# Patient Record
Sex: Female | Born: 1970 | Hispanic: Yes | Marital: Single | Smoking: Never smoker
Health system: Southern US, Community
[De-identification: ages and names within clinical notes are randomized; demographics above are authoritative.]

## PROBLEM LIST (undated history)

## (undated) DIAGNOSIS — M778 Other enthesopathies, not elsewhere classified: Secondary | ICD-10-CM

## (undated) DIAGNOSIS — M62838 Other muscle spasm: Secondary | ICD-10-CM

## (undated) HISTORY — DX: Other enthesopathies, not elsewhere classified: M77.8

## (undated) HISTORY — PX: CHOLECYSTECTOMY, LAPAROSCOPIC: SHX56

## (undated) HISTORY — DX: Other muscle spasm: M62.838

---

## 2005-10-08 ENCOUNTER — Emergency Department: Payer: Self-pay | Admitting: Emergency Medicine

## 2005-10-09 ENCOUNTER — Ambulatory Visit: Payer: Self-pay | Admitting: Emergency Medicine

## 2005-10-12 ENCOUNTER — Ambulatory Visit: Payer: Self-pay | Admitting: General Surgery

## 2005-10-15 ENCOUNTER — Inpatient Hospital Stay: Payer: Self-pay | Admitting: General Surgery

## 2006-04-17 ENCOUNTER — Emergency Department: Payer: Self-pay | Admitting: Emergency Medicine

## 2007-01-01 IMAGING — CR DG CHOLANGIOGRAM OPERATIVE
1 series · 2 of 2 positions shown · non-contrast
Comparison: none

REASON FOR EXAM: Op cholangiogram
COMMENTS:

[Series 1: view not recorded · 0.17mm/px · 2 of 2 slices shown]
[im 1/2]
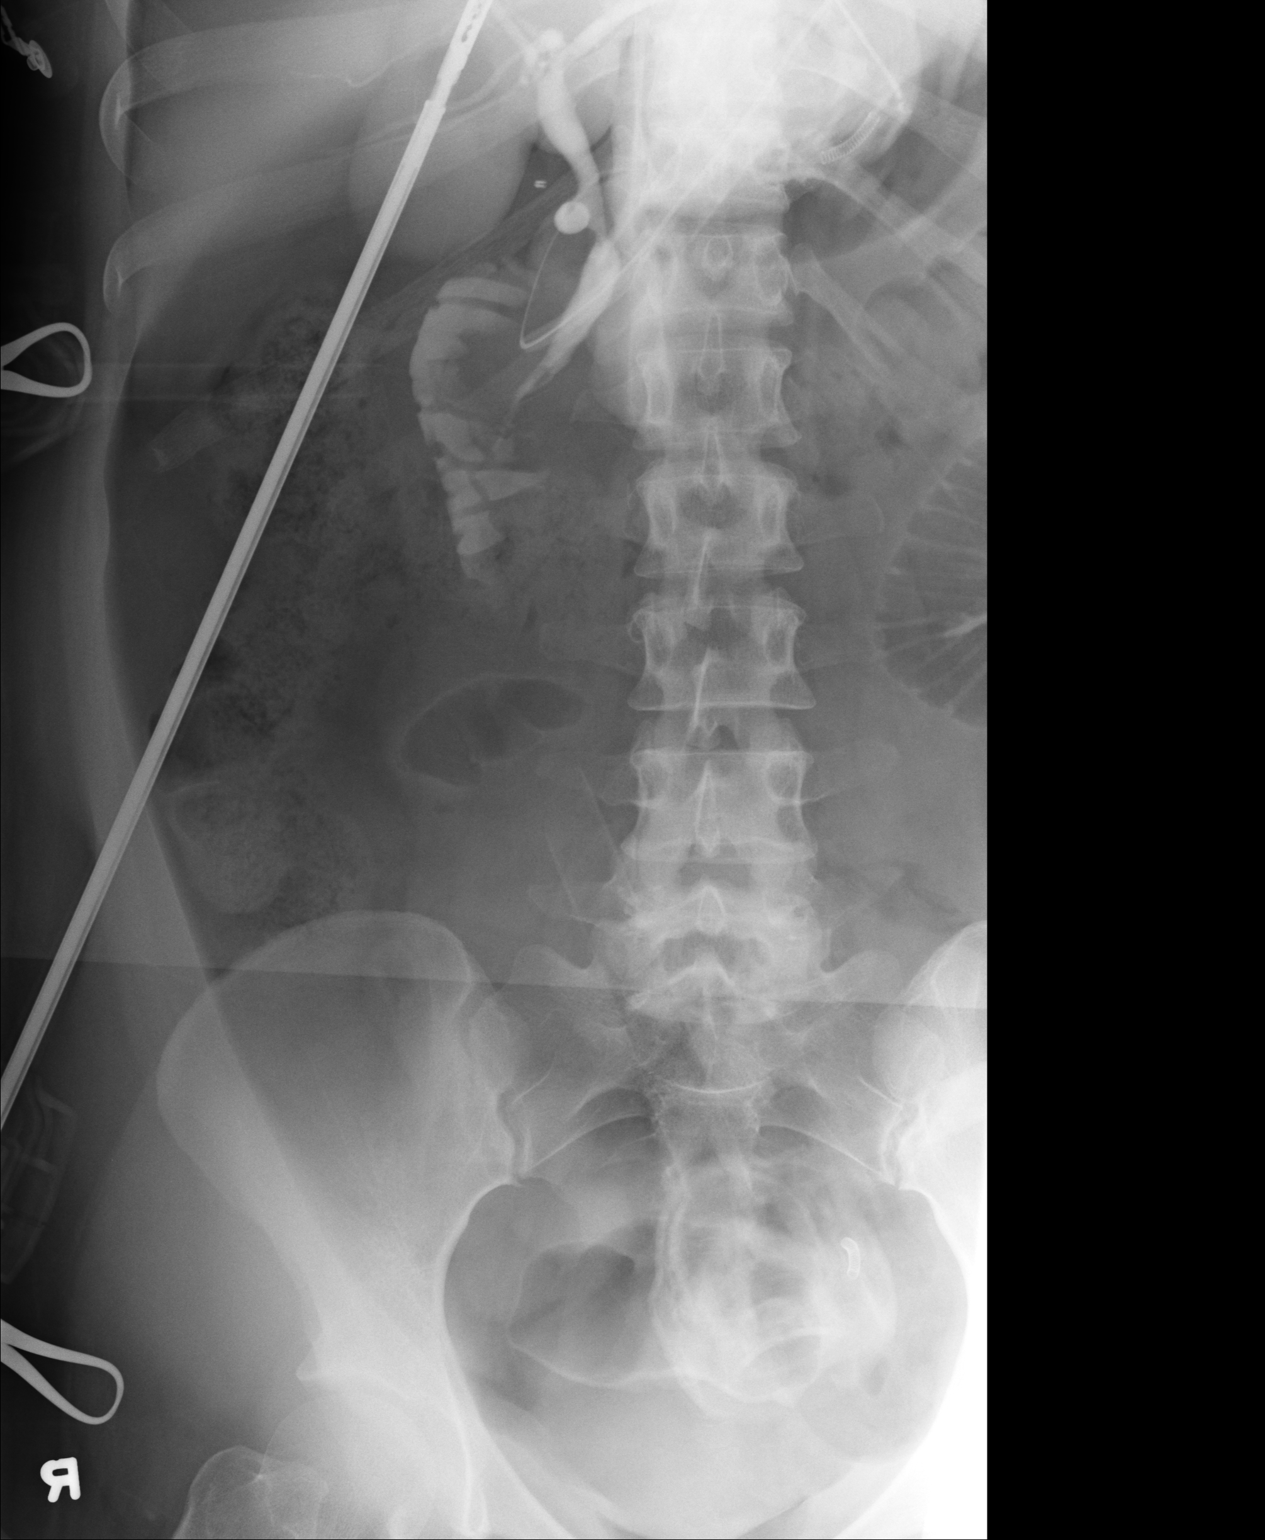
[im 2/2]
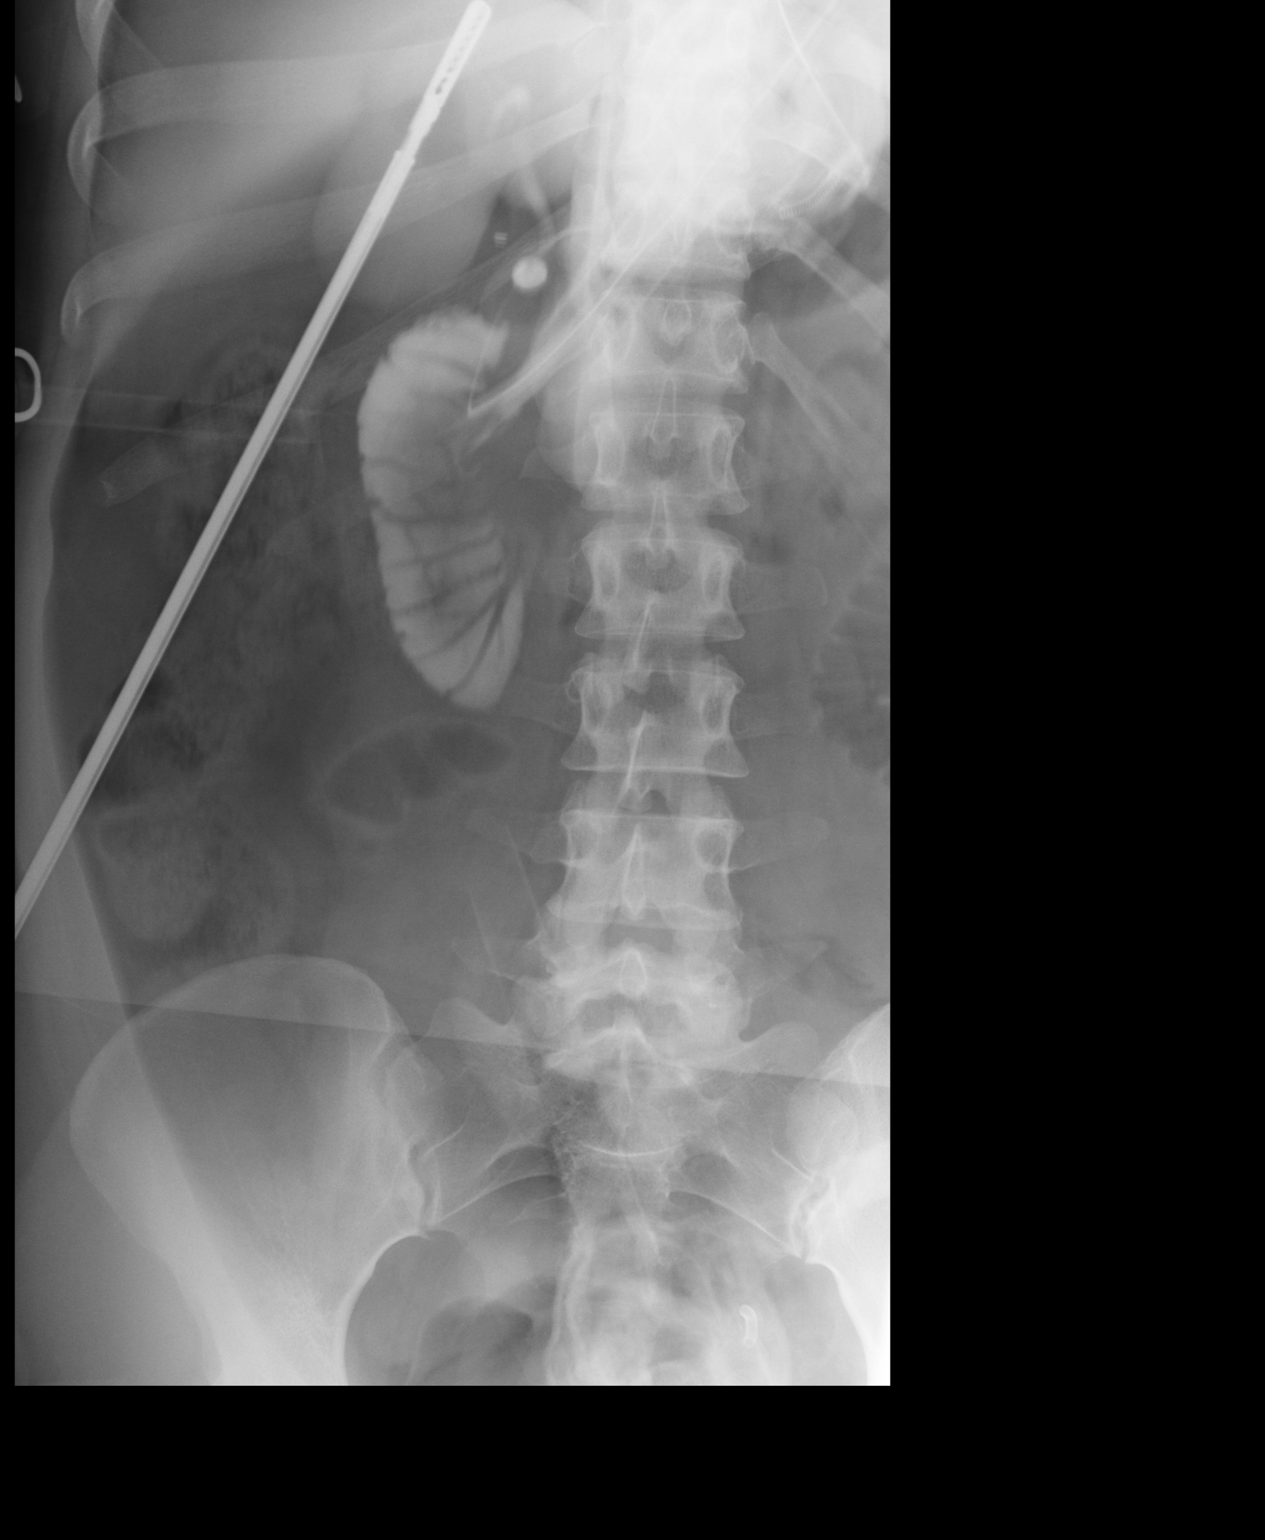

[2 of 2 positions shown; findings below may reference images not displayed]

PROCEDURE:     DXR - DXR CHOLANGIOGRAM OP (INITIAL)  - October 12, 2005 [DATE]

RESULT:       Contrast material is visualized in the common duct and hepatic
ducts.  There is a radiolucent filling defect in the distal common duct
which could represent stone, air bubble or clot. Contrast is noted to flow
into the duodenum without evidence of obstruction.  No dilatation of the
common duct is observed.
IMPRESSION: 1.     There is a nonspecific filling defect in the distal common duct as
noted above.
2.     No common duct obstruction is seen.

## 2007-01-03 IMAGING — CR DG ABDOMEN 3V
1 series · 4 of 4 positions shown · non-contrast
Comparison: none

REASON FOR EXAM: abdominal pain
COMMENTS:

[Series 1: view not recorded · 0.17mm/px · 4 of 4 slices shown]
[im 1/4]
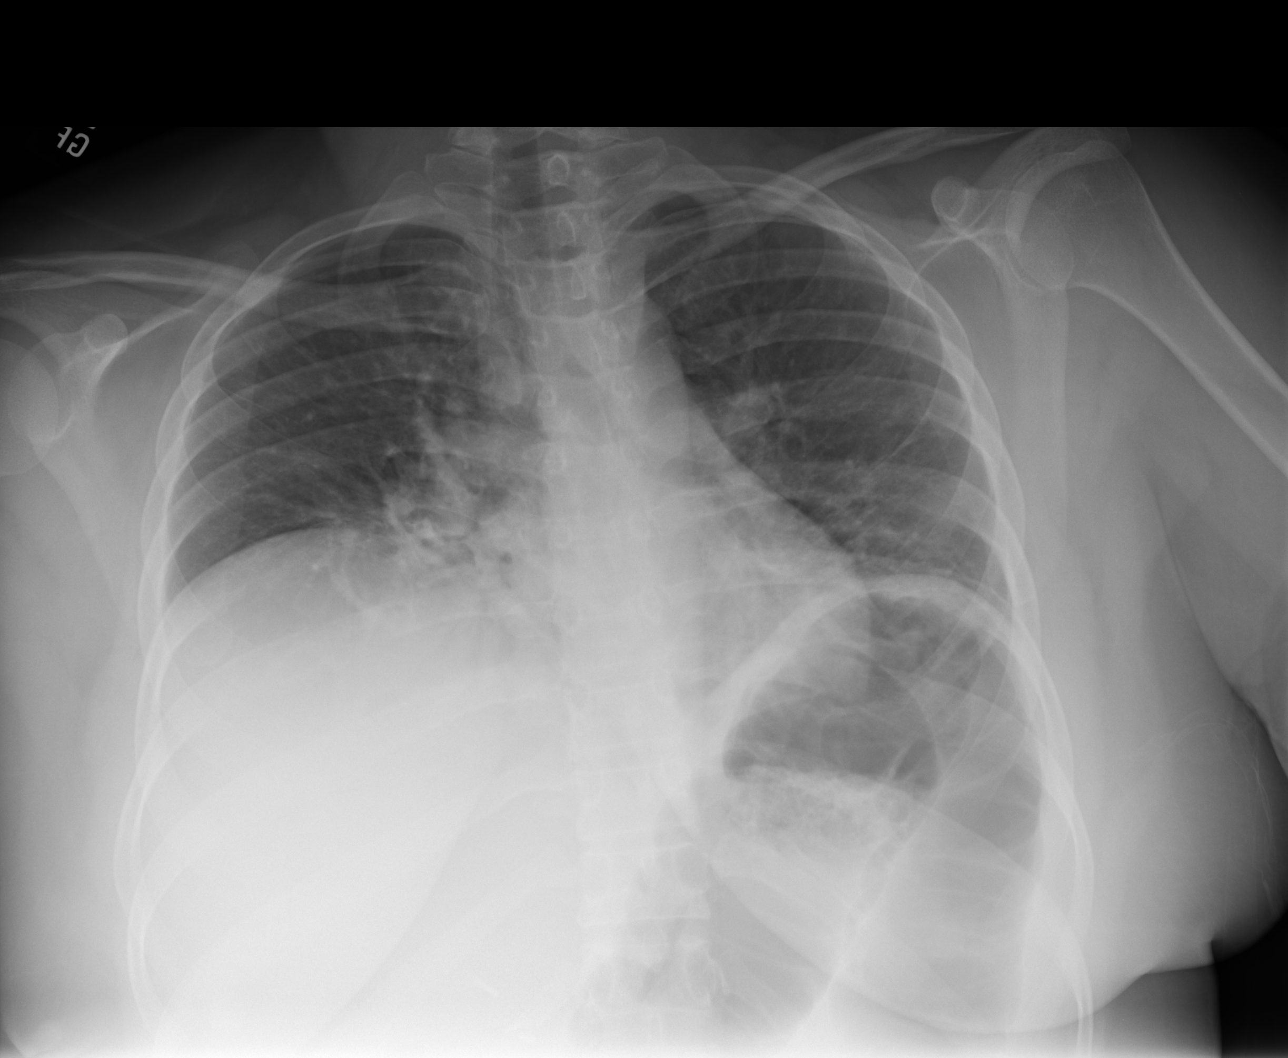
[im 2/4]
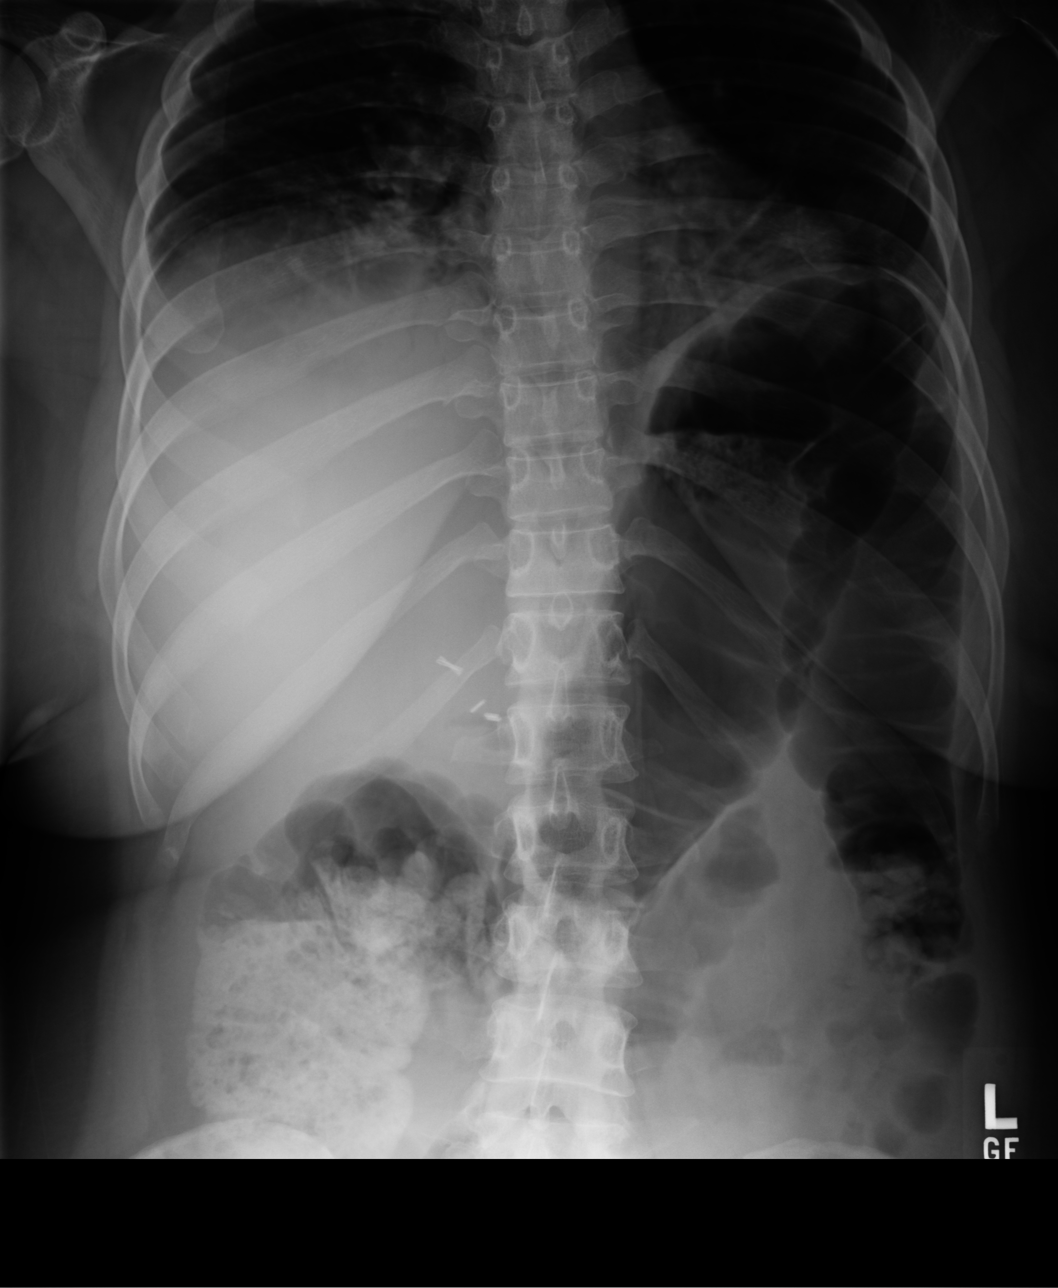
[im 3/4]
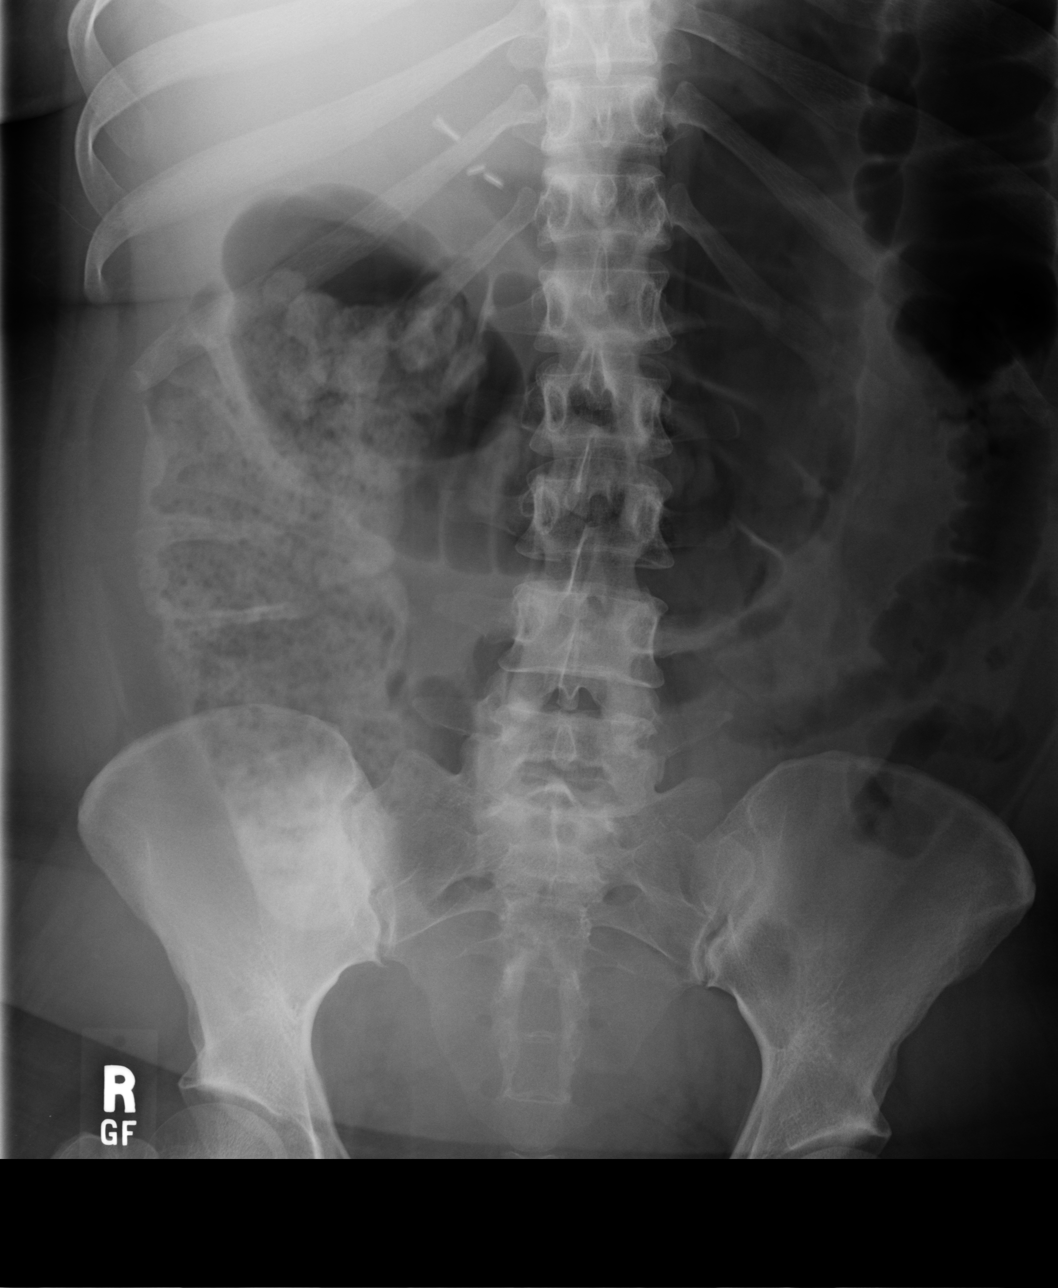
[im 4/4]
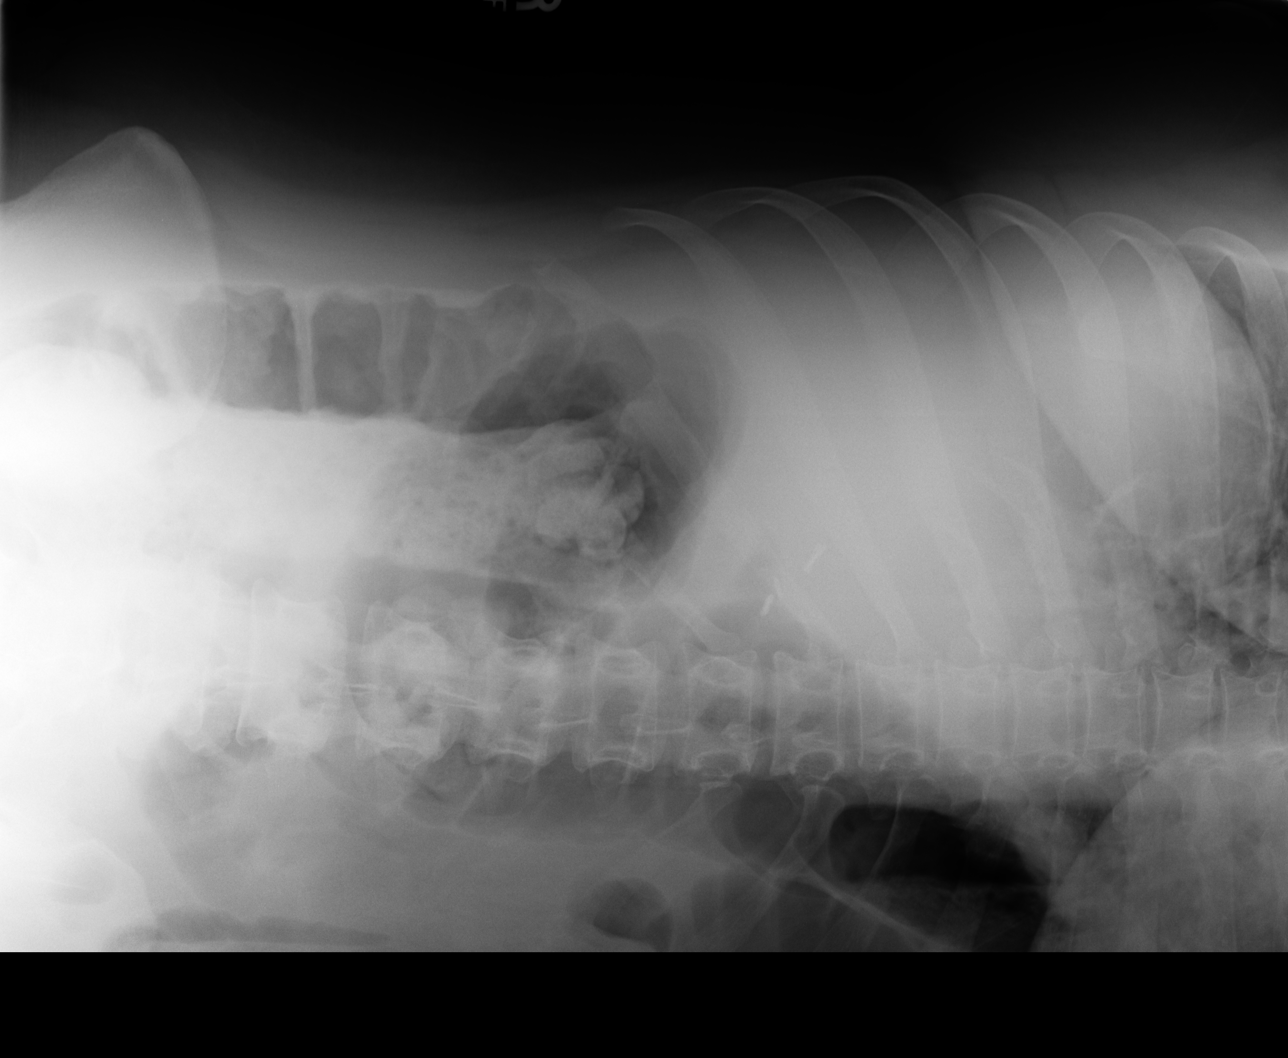

[4 of 4 positions shown; findings below may reference images not displayed]

PROCEDURE:     DXR - DXR ABDOMEN 3-WAY (INCL PA CXR)  - October 14, 2005  [DATE]

RESULT:          The soft tissue structures are unremarkable.  Surgical
clips are noted in RIGHT upper quadrant.  The gas pattern is nonspecific.
Air-filled loops of small bowel are noted.  There is no free air.  Bibasilar
atelectasis and/or pneumonia is present.
IMPRESSION: 1.     Bibasilar atelectasis and/or pneumonia.
2.     The gas pattern is nonspecific.  There are nondilated air-filled
loops of small bowel.  The colonic gas pattern is normal.  A very minimal
adynamic ileus cannot be entirely excluded.
3.     Surgical clips are noted in the RIGHT upper quadrant.  If the
abdominal symptoms persist, a followup abdominal series may prove useful.

## 2015-04-23 ENCOUNTER — Emergency Department: Payer: No Typology Code available for payment source

## 2015-04-23 ENCOUNTER — Emergency Department
Admission: EM | Admit: 2015-04-23 | Discharge: 2015-04-23 | Disposition: A | Discharge status: Home / Self Care | Payer: No Typology Code available for payment source | Attending: Emergency Medicine | Admitting: Emergency Medicine

## 2015-04-23 ENCOUNTER — Encounter: Payer: Self-pay | Admitting: Emergency Medicine

## 2015-04-23 DIAGNOSIS — S3991XA Unspecified injury of abdomen, initial encounter: Secondary | ICD-10-CM | POA: Insufficient documentation

## 2015-04-23 DIAGNOSIS — Y998 Other external cause status: Secondary | ICD-10-CM | POA: Insufficient documentation

## 2015-04-23 DIAGNOSIS — Y9389 Activity, other specified: Secondary | ICD-10-CM | POA: Insufficient documentation

## 2015-04-23 DIAGNOSIS — S59901A Unspecified injury of right elbow, initial encounter: Secondary | ICD-10-CM | POA: Diagnosis present

## 2015-04-23 DIAGNOSIS — S5001XA Contusion of right elbow, initial encounter: Secondary | ICD-10-CM | POA: Insufficient documentation

## 2015-04-23 DIAGNOSIS — F10120 Alcohol abuse with intoxication, uncomplicated: Secondary | ICD-10-CM | POA: Diagnosis not present

## 2015-04-23 DIAGNOSIS — S20211A Contusion of right front wall of thorax, initial encounter: Secondary | ICD-10-CM | POA: Insufficient documentation

## 2015-04-23 DIAGNOSIS — G44319 Acute post-traumatic headache, not intractable: Secondary | ICD-10-CM | POA: Insufficient documentation

## 2015-04-23 DIAGNOSIS — F10920 Alcohol use, unspecified with intoxication, uncomplicated: Secondary | ICD-10-CM

## 2015-04-23 DIAGNOSIS — S060X9A Concussion with loss of consciousness of unspecified duration, initial encounter: Secondary | ICD-10-CM | POA: Insufficient documentation

## 2015-04-23 DIAGNOSIS — Y92410 Unspecified street and highway as the place of occurrence of the external cause: Secondary | ICD-10-CM | POA: Diagnosis not present

## 2015-04-23 LAB — COMPREHENSIVE METABOLIC PANEL
ALK PHOS: 57 U/L (ref 38–126)
ALT: 14 U/L (ref 14–54)
ANION GAP: 7 (ref 5–15)
AST: 32 U/L (ref 15–41)
Albumin: 3.9 g/dL (ref 3.5–5.0)
BILIRUBIN TOTAL: 0.7 mg/dL (ref 0.3–1.2)
BUN: 13 mg/dL (ref 6–20)
CALCIUM: 8.8 mg/dL — AB (ref 8.9–10.3)
CO2: 22 mmol/L (ref 22–32)
Chloride: 109 mmol/L (ref 101–111)
Creatinine, Ser: 0.66 mg/dL (ref 0.44–1.00)
Glucose, Bld: 97 mg/dL (ref 65–99)
POTASSIUM: 4.3 mmol/L (ref 3.5–5.1)
Sodium: 138 mmol/L (ref 135–145)
TOTAL PROTEIN: 7.6 g/dL (ref 6.5–8.1)

## 2015-04-23 LAB — CBC
HEMATOCRIT: 33.7 % — AB (ref 35.0–47.0)
HEMOGLOBIN: 10.3 g/dL — AB (ref 12.0–16.0)
MCH: 18.9 pg — AB (ref 26.0–34.0)
MCHC: 30.6 g/dL — AB (ref 32.0–36.0)
MCV: 61.8 fL — ABNORMAL LOW (ref 80.0–100.0)
Platelets: 367 10*3/uL (ref 150–440)
RBC: 5.45 MIL/uL — ABNORMAL HIGH (ref 3.80–5.20)
RDW: 18.3 % — AB (ref 11.5–14.5)
WBC: 9.8 10*3/uL (ref 3.6–11.0)

## 2015-04-23 LAB — ETHANOL: ALCOHOL ETHYL (B): 13 mg/dL — AB (ref <5)

## 2015-04-23 MED ORDER — NAPROXEN 500 MG PO TABS: 500.0000 mg | ORAL_TABLET | Freq: Two times a day (BID) | ORAL | Status: AC

## 2015-04-23 MED ORDER — IOHEXOL 350 MG/ML SOLN
100.0000 mL | Freq: Once | INTRAVENOUS | Status: AC | PRN
  Administered 2015-04-23: 100 mL via INTRAVENOUS
  Filled 2015-04-23: qty 100
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
## 2015-04-23 NOTE — ED Notes (Signed)
PA at bedside.

## 2015-04-23 NOTE — Discharge Instructions (Signed)
Traumatismo Torcico Contuso (Blunt Chest Trauma)  El traumatismo torcico contuso es una lesin causada por un golpe en el pecho. Estas lesiones suelen ser muy dolorosas. Generalmente resulta en un hematoma o en costillas rotas (fracturadas). La mayor parte de los hematomas y las fracturas de costillas por traumatismos torcicos contusos mejoran despus de 1 a 3 semanas de reposo y uso de medicamentos para Chief Technology Officerel dolor. Generalmente, los tejidos blandos de la pared torcica tambin se lesionan, lo que produce dolor y hematomas. Los rganos internos, como el corazn y los pulmones, tambin pueden sufrir lesiones. El traumatismo torcico contuso puede producir problemas mdicos graves. Este tipo de lesin requiere atencin mdica inmediata.  CAUSAS   Colisiones en vehculos de motor.  Cadas.  Violencia fsica.  Lesiones deportivas. SNTOMAS   Dolor en el pecho. El dolor puede empeorar al moverse o respirar profundamente.  Falta de aire.  Aturdimiento.  Hematomas.  Sensibilidad.  Hinchazn. DIAGNSTICO  El mdico le har un examen fsico. Podrn tomarle radiografas para comprobar si hay fracturas. Sin embargo, las fracturas pequeas en las costillas pueden no aparecer en las radiografas hasta unos das despus de la lesin. Si se sospecha una lesin ms grave, podrn indicarle otras pruebas de diagnstico por imgenes. Puede incluir ecografas, tomografa computada (TC) o resonancia magntica (IMR).  TRATAMIENTO  El tratamiento depende de la gravedad de la lesin. El mdico podr recetarle medicamentos para Primary school teachercalmar el dolor e indicarle ejercicios de respiracin profunda.  INSTRUCCIONES PARA EL CUIDADO EN EL HOGAR   Limite sus actividades hasta que pueda moverse sin sentir Scientist, forensicdemasiado dolor.  No realice trabajos extenuantes hasta que la lesin se haya curado.  Aplique hielo sobre la zona lesionada.  Ponga el hielo en una bolsa plstica.  Colquese una toalla entre la piel y la bolsa  de hielo.  Deje el hielo durante 15 a 20 minutos, 3 a 4 veces por da.  Podr utilizar una faja para las costillas para reducir Chief Technology Officerel dolor segn le hayan indicado.  Realice inspiraciones, profundas segn las indicaciones del mdico, para mantener los pulmones limpios.  Slo tome medicamentos de venta libre o recetados para Primary school teachercalmar el dolor, la fiebre, o el Lafayettemalestar, segn las indicaciones de su mdico. SOLICITE ATENCIN MDICA DE Engelhard CorporationNMEDIATO SI:  Siente falta de aire o dolor en el pecho cada vez ms intensos.  Tose y escupe sangre.  Tiene nuseas, vmitos o dolor abdominal.  Tiene fiebre.  Se siente mareado, dbil o se desmaya. ASEGRESE DE QUE:   Comprende estas instrucciones.  Controlar su enfermedad.  Solicitar ayuda de inmediato si no mejora o si empeora.   Esta informacin no tiene Theme park managercomo fin reemplazar el consejo del mdico. Asegrese de hacerle al mdico cualquier pregunta que tenga.   Document Released: 05/01/2005 Document Revised: 07/24/2011 Elsevier Interactive Patient Education 2016 [employer]vier Inc.  Conmocin cerebral - Adultos (Concussion, Adult) Una conmocin cerebral, o traumatismo cerebral cerrado, es una lesin cerebral causada por un golpe directo en la cabeza o por un movimiento rpido y brusco (sacudida) de la cabeza o el cuello. Generalmente no pone en peligro la vida. An as, los efectos de [place] conmocin cerebral pueden ser graves. Si sufri una conmocin cerebral antes, es ms probable que experimente sntomas similares a una conmocin cerebral despus de un golpe directo en la cabeza.  CAUSAS   Un golpe directo en la cabeza, como al chocar contra otro jugador en un partido de ftbol, recibir un golpe en una lucha o golpearse la cabeza contra una superficie  dura.  Una sacudida de la cabeza o el cuello que hace que el cerebro se mueva hacia adelante y [place] Wheeler atrs dentro del crneo, como en un choque automovilstico. SIGNOS Y SNTOMAS  Los signos de una  conmocin cerebral pueden ser difciles de Chief Strategy Officer. En un primer momento, los pacientes, familiares y profesionales tal vez no los adviertan. Puede ser que aparentemente est normal pero que acte o se sienta diferente. Por lo general, los sntomas son transitorios pero pueden durar [employer], semanas o an ms. Algunos sntomas pueden aparecer inmediatamente mientras otros pueden manifestarse despus de algunas horas o [address]  [address]. Cada lesin en la cabeza es diferente. Los sntomas son:   Dolor de cabeza leve a moderado, que no se [place].  Sensacin de presin dentro de la cabeza.  Presentar ms dificultad que lo habitual para:  Aprender o recordar cosas que ha escuchado.  Responder preguntas.  Prestar atencin o concentrarse.  Organizar las tareas diarias.  Tomar decisiones y [employer].  Lentitud para pensar, actuar o reaccionar, hablar o leer.  Sentirse perdido o confundirse con facilidad.  Sentirse cansado todo el tiempo o con falta de Engineer, drilling (fatigado).  Sentirse somnoliento.  Trastornos del sueo.  Dormir ms que lo habitual.  Dormir menos que lo habitual.  Problemas para conciliar el sueo.  Problemas para dormir (insomnio).  Prdida del equilibrio, sensacin de mareo.  Nuseas o vmitos.  Adormecimiento u hormigueo.  Mayor sensibilidad para:  Los sonidos.  Las luces.  Distracciones.  Problemas visuales o cansancio en los ojos.  Disminucin del sentido del gusto o Cabin crew.  Zumbidos en los odos.  Cambios de humor, como sentirse triste o ansioso.  Irritacin o enojo por cosas pequeas o sin motivos.  Falta de motivacin.  Ver o escuchar cosas que otras personas no ven ni escuchan (alucinaciones). DIAGNSTICO  El mdico diagnosticar una conmocin cerebral basndose en la descripcin de la lesin y los sntomas. Le preguntar si se desmay (perdi el conocimiento) y si tiene dificultad para recordar los sucesos ocurridos  inmediatamente antes y durante el traumatismo.  La evaluacin tambin puede incluir:   Un escaneo cerebral para encontrar signos de lesin cerebral. Aunque los estudios no muestren lesiones, igual puede haber sufrido una conmocin cerebral.  Anlisis de sangre para asegurarse de que no hay otros problemas. TRATAMIENTO   La mayor parte de las conmociones se tratan en el servicio de emergencias, el rea de atencin de urgencia o el consultorio mdico. Es posible que deba Engineer, maintenance hospital durante la noche para Advertising account planner.  Informe a su mdico si toma medicamentos, incluidos los medicamentos recetados, medicamentos de venta libre y remedios naturales. Algunos medicamentos, como los anticoagulantes y la aspirina, pueden aumentar la probabilidad de sufrir complicaciones. Tambin informe a su mdico si ha bebido alcohol o consume drogas. Esta informacin puede Licensed conveyancer.  El mdico le dar el alta con instrucciones importantes que deber seguir.  La velocidad de recuperacin de una conmocin cerebral depende de varios factores. Entre ellos se incluyen la gravedad de la conmocin cerebral, la zona del cerebro lesionada, la edad y [place] de salud previo a la conmocin cerebral.  La mayora de las personas que han sufrido una lesin leve se recuperan completamente. La recuperacin puede llevar tiempo. En general, la recuperacin es ms lenta en las [employer]. Adems, a aquellas personas que ya han sufrido una conmocin cerebral en el pasado les llevar ms tiempo recuperarse de la lesin actual. INSTRUCCIONES PARA EL  CUIDADO EN EL HOGAR  Instrucciones generales  Siga cuidadosamente las indicaciones que su mdico le ha dado.  Utilice los medicamentos de venta libre o recetados para Primary school teacher, Environmental health practitioner o la fiebre, segn se lo indique el mdico.  Tome slo los medicamentos que su mdico le haya autorizado.  No beba alcohol hasta que su mdico lo  autorice. El alcohol y algunas otras sustancias pueden demorar su recuperacin y ponerlo en riesgo de nuevas lesiones.  Si le resulta ms difcil que lo habitual recordar las cosas, escrbalas.  Trate de hacer una cosa por vez si se distrae con facilidad. Por ejemplo, no mire televisin mientras prepara la cena.  Consulte con familiares y amigos si debe tomar decisiones importantes.  Cumpla con todas las visitas de control. Se recomienda realizar varias evaluaciones de los sntomas para favorecer su recuperacin.  Controle sus sntomas y pdale a otras personas que tambin lo hagan. En algunos casos pueden aparecer complicaciones luego de una conmocin cerebral. Los adultos mayores con lesin cerebral pueden tener un mayor riesgo de complicaciones graves, como un cogulo sanguneo en el cerebro.  Informe a los [employer], el departamento de enfermera de la escuela, el consejero escolar, Public affairs consultant acerca de su lesin, los sntomas y las restricciones que tiene. Hgales saber lo que puede y lo que no Scientist, product/process development. Ellos debern observar:  Aumento en los problemas de atencin o Librarian, academic.  Ms dificultades para recordar o aprender informacin nueva.  Aumento del tiempo que necesita para completar tareas o encargos.  Aumento de la irritabilidad o disminucin de la capacidad para Social worker.  Aparicin de nuevos sntomas.  Haga reposo. El descanso favorece la curacin del cerebro. Asegrese de que:  Duerme bien por la noche. Evite quedarse despierto muy tarde por la noche.  Se va a dormir a la [employer] de [address] y los fines de [place].  Descanse Administrator. Toma siestas durante el da o momentos de descanso cuando se siente cansado.  Limita las actividades que requieren mucha atencin o Librarian, academic. Estas pueden ser:  Tareas para el hogar o trabajos relacionados con el empleo.  Mirar televisin.  Trabajar en la computadora.  Evite toda  situacin en la que se pudiera producir otra lesin cerebral (ftbol americano, hockey, ftbol, bsquet, artes marciales, deportes de nieve cuesta abajo y andar a caballo). El problema empeorar cada vez que experimente una conmocin cerebral. Debe evitar estas actividades hasta que sea evaluado por el mdico correspondiente. Regreso a las Mining engineer a sus actividades habituales gradualmente, no de [place]. Debe darles al cuerpo y el cerebro su tiempo para recuperarse.  No retome la prctica de deportes ni otras actividades deportivas hasta que su mdico le diga que puede [place].  Consulte a su mdico sobre cundo puede conducir automviles, andar en bicicleta u operar mquinas pesadas. La capacidad para reaccionar puede ser ms lenta luego de una lesin cerebral. Nunca realice estas actividades si se siente mareado.  Pregunte a su mdico cuando podr volver a la escuela o al Aleen Campi. Prevencin de otra conmocin cerebral Es muy importante que evite otra lesin cerebral, especialmente antes de que se haya recuperado. En casos raros, una nueva lesin puede causar daos cerebrales permanentes, hinchazn del cerebro o la muerte. El riesgo es mayor durante los primeros 7 a 10 das despus de una lesin en la cabeza. Evite las lesiones:   Use el cinturn de seguridad al conducir un automvil.  Beba alcohol solo con moderacin.  Use un casco cuando ande en bicicleta, esque, patine o realice actividades similares.  Evite actividades que podran causar una segunda conmocin cerebral, como deportes de contacto o recreativos hasta que su mdico lo autorice.  Implemente medidas de seguridad en el hogar.  Evite el desorden y objetos que puedan ser peligrosos en pisos y escaleras.  Coloque barras en los baos y pasamanos en las escaleras.  Ponga alfombras antideslizantes en pisos y baeras.  Mejore la iluminacin en zonas de penumbra. SOLICITE ATENCIN MDICA SI:    Aumentan sus problemas de atencin o concentracin.  Aumentan sus dificultades para recordar o aprender informacin nueva.  Necesita ms tiempo que antes para completar las tareas o asignaciones.  Aumenta la irritabilidad o disminuye la capacidad para Social worker.  Tiene ms sntomas que antes. Solicite atencin mdica si presenta alguno de los siguientes sntomas durante ms de 2 semanas despus de su lesin:   Dolor de cabeza que perdura (crnico).  Mareos o problemas con el equilibrio.  Nuseas.  Problemas de visin.  Aumento de la sensibilidad a los ruidos o Statistician.  Depresin y cambios en el estado de nimo.  Ansiedad o irritabilidad.  Problemas de memoria.  Dificultad para concentrarse o Engineer, technical sales.  Problemas para dormir.  Cansancio permanente. SOLICITE ATENCIN MDICA DE INMEDIATO SI:   Los dolores de [place] son intensos o empeoran. Esto puede ser un signo de que hay un cogulo sanguneo en la cabeza.  Siente debilidad (aun si es solo en [place], una pierna o parte del rostro).  Siente entumecimiento.  Le falta coordinacin.  Vomita repetidas veces.  Est ms somnoliento.  Una pupila est ms grande que la otra.  Tiene convulsiones.  Tiene dificultad para hablar.  Siente una confusin que va en aumento. Esto puede ser un signo de que hay un cogulo sanguneo en el cerebro.  Aumenta la intranquilidad, la agitacin o la irritabilidad.  No puede Nutritional therapist o lugares.  Siente dolor en el cuello.  Le resulta difcil despertarse.  Tiene cambios de conducta inusuales.  Pierde la conciencia. ASEGRESE DE QUE:   Comprende estas instrucciones.  Controlar su afeccin.  Recibir ayuda de inmediato si no mejora o si empeora.   Esta informacin no tiene Theme park manager el consejo del mdico. Asegrese de hacerle al mdico cualquier pregunta que tenga.   Document Released: 04/13/2008 Document Revised:  05/22/2014 Elsevier Interactive Patient Education 2016 [employer].  Dolor de cabeza general sin causa (General Headache Without Cause) El dolor de cabeza es un dolor o Dentist que se siente en la zona de la cabeza o del cuello. Puede no tener una causa especfica. Hay muchas causas y tipos de dolores de [place]. Los dolores de cabeza ms comunes son los siguientes:  [place]efalea tensional.  Cefaleas migraosas.  Cefalea en brotes.  Cefaleas diarias crnicas. INSTRUCCIONES PARA EL CUIDADO EN EL HOGAR  Controle su afeccin para ver si hay cambios. Siga estos pasos para Scientist, physiological afeccin: Control del [employer] medicamentos de venta libre y los recetados solamente como se lo haya indicado el mdico.  Cuando sienta dolor de cabeza acustese en un cuarto oscuro y tranquilo.  Si se lo indican, aplique hielo sobre la cabeza y la zona del cuello:  Ponga el hielo en una bolsa plstica.  Coloque una toalla entre la piel y la bolsa de hielo.  Coloque el hielo durante , 2 a 3veces por Futures trader.  Melina [employer]  una almohadilla trmica o tome una ducha con agua caliente para aplicar calor en la cabeza y la zona del cuello como se lo haya indicado el mdico.  Mantenga las luces tenues si le [place] luces brillantes o sus dolores de cabeza empeoran. Comida y bebida  Mantenga un horario para las comidas.  Limite el consumo de bebidas alcohlicas.  Consuma menos cantidad de cafena o deje de tomarla. Instrucciones generales  Concurra a todas las visitas de control como se lo haya indicado el mdico. Esto es importante.  Lleve un diario de los dolores de cabeza para Financial risk analyst qu factores pueden desencadenarlos. Por ejemplo, escriba los siguientes datos:  Lo que usted come y Estate agent.  Cunto tiempo duerme.  Algn cambio en su dieta o en los medicamentos.  Pruebe algunas tcnicas de relajacin, como los [place].  Limite el estrs.  Sintese con la espalda recta y no tense  los msculos.  No consuma productos que contengan tabaco, incluidos cigarrillos, tabaco de Theatre manager o cigarrillos electrnicos. Si necesita ayuda para dejar de fumar, consulte al mdico.  Haga actividad fsica habitualmente como se lo haya indicado el mdico.  Tenga un horario fijo para dormir. Duerma entre 7 y 9horas o la cantidad de horas que le haya recomendado el mdico. SOLICITE ATENCIN MDICA SI:   Los medicamentos no Materials engineer los sntomas.  Tiene un dolor de cabeza que es diferente del dolor de cabeza habitual.  Tiene nuseas o vmitos.  Tiene fiebre. SOLICITE ATENCIN MDICA DE INMEDIATO SI:   El dolor se hace cada vez ms intenso.  Ha vomitado repetidas veces.  Presenta rigidez en el cuello.  Sufre prdida de la visin.  Tiene problemas para hablar.  Siente dolor en el ojo o en el odo.  Presenta debilidad muscular o prdida del control muscular.  Pierde el equilibrio o tiene problemas para Advertising account planner.  Sufre mareos o se desmaya.  Se siente confundido.   Esta informacin no tiene Theme park manager el consejo del mdico. Asegrese de hacerle al mdico cualquier pregunta que tenga.   Document Released: 02/08/2005 Document Revised: 01/20/2015 Elsevier Interactive Patient Education 2016 Elsevier Inc.  Hematoma peristico (Periosteal Hematoma) El hematoma peristico (hematoma seo) es una zona localizada, sensible y prominente cerca del hueso. Se puede producir debido a Market researcher pequea y [place] del [place], luego de [place] o debido a otro tipo de traumatismo en la zona. Normalmente ocurre en los [employer] cerca de la superficie de la piel, como la tibia, la rodilla y el hueso del taln. Si bien pueden tardar 2semanas o ms en curarse completamente, por lo general, los hematomas seos no se asocian con daos permanentes o graves en el hueso. Si toma anticoagulantes, posiblemente corra ms riesgo de sufrir tales lesiones.  CAUSAS  Normalmente, la  causa de un hematoma seo es un traumatismo de alto impacto en el [place], pero puede estar causado por lesiones deportivas o torceduras. SIGNOS Y SNTOMAS   Dolor intenso alrededor de la zona lesionada que normalmente dura ms tiempo que en un hematoma normal.  Dificultad para usar la zona del hematoma.  Zona dolorida y prominente cerca del hueso.  Luxacin o hinchazn en la zona del hematoma. DIAGNSTICO  Posiblemente necesite una resonancia magntica (RM) de la zona lesionada para confirmar el hematoma seo, en el caso de que el mdico lo considere necesario. Una radiografa normal no detectar un hematoma seo, pero detectar un hueso roto (fractura). Se puede hacer una radiografa para descartar cualquier fractura.  TRATAMIENTO  Con frecuencia, el mejor tratamiento para un hematoma seo es el reposo y la aplicacin de hielo y de compresas fras en la zona de la lesin. Para calmar el dolor tambin podrn recomendarle medicamentos de venta libre. INSTRUCCIONES PARA EL CUIDADO EN EL HOGAR  Estas son algunas de las cosas que puede hacer para Scientist, clinical (histocompatibility and immunogenetics) la afeccin:   Descanse y eleve la zona de la lesin, mientras est muy sensible o inflamada.  Aplique hielo sobre la zona lesionada.  Ponga el hielo en una bolsa plstica.  Coloque una toalla entre la piel y la bolsa de hielo.  Deje el hielo durante 20 minutos, 2 a 3 veces por da.  Use un vendaje elstico para reducir la hinchazn y proteger la zona lesionada. Asegrese de no ajustarlo demasiado. Si la zona alrededor del vendaje se pone fra o azul, el vendaje est demasiado [place]. Afljelo un poco.  A la hora de realizar actividades tenga en cuenta lo siguiente:  Siga las indicaciones del mdico sobre si es necesario caminar con [place]. Esto depender de la gravedad de su afeccin.  Comience gradualmente a llevar peso en la zona del hematoma.  Siga usando muletas o un bastn hasta que pueda pararse sin sentir dolor, o segn las  indicaciones.  Si le colocaron una frula:  sela hasta que asista a la consulta para realizarse el examen de seguimiento.  Hgala reposar sobre algo no ms duro que una almohada durante las primeras 24 horas.  No le coloque peso.  No deje que se humedezca. Puede quitrsela para tomar una ducha o un bao.  Es posible que le hayan dado un vendaje elstico para usar con la frula o solo. La frula est demasiado ajustada si siente adormecimiento u hormigueo, o si la piel alrededor del vendaje se pone fra y [place]. Acomode el vendaje para hacerlo ms confortable.  Si le colocaron una frula inflable, puede hacer lo siguiente:  Puede modificar la cantidad de aire en la frula, segn sea necesario por cuestiones de comodidad.  Puede quitrsela por la noche para tomar una ducha o un bao.  Si la lesin se produjo en alguna de las piernas, [employer] los dedos del pie en la frula varias veces al da, en el caso de que pueda [place].  Utilice los medicamentos de venta libre o recetados para Primary school teacher, el malestar o la fiebre, segn se lo indique el mdico.  Cumpla con todas las visitas de control, segn le indique su mdico. Aqu se incluyen derivaciones a un ortopedista, fisioterapia y rehabilitacin. Si no obtiene la asistencia necesaria a Chief Strategy Officer, la curacin del hueso puede demorarse y Armed forces technical officer. SOLICITE ATENCIN MDICA SI:   Los hematomas, la hinchazn, la sensibilidad, Location manager en la lesin [place].  Nota que los dedos de los pies estn fros y esto no mejora luego de quitar la frula o los vendajes.  El dolor no cesa luego de Designer, industrial/product.  Aumentan las dificultades a la hora de soportar peso en la pierna lesionada, en el caso de que la lesin sea en alguna de las piernas. SOLICITE ATENCIN MDICA DE INMEDIATO SI:   Siente dolor intenso cerca de la zona lesionada o al Therapist, music.  La hinchazn aument y dio como resultado una zona tensa y dura, o la prdida  de la sensibilidad en la zona de la lesin.  La piel debajo de la zona de la lesin (en una extremidad) est plida y fra, y esto no  desaparece despus de quitar la frula o los vendajes. ASEGRESE DE QUE:   Comprende estas instrucciones.  Controlar su afeccin.  Recibir ayuda de inmediato si no mejora o si empeora.   Esta informacin no tiene Theme park manager el consejo del mdico. Asegrese de hacerle al mdico cualquier pregunta que tenga.   Document Released: 05/01/2005 Document Revised: 02/19/2013 Elsevier Interactive Patient Education [employer]

## 2015-04-23 NOTE — ED Notes (Signed)
Patient is unsure if she hit her head and is unsure if she LOC.  Main complaint is right sided generalized pain and headache.
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
## 2022-04-24 ENCOUNTER — Ambulatory Visit (LOCAL_COMMUNITY_HEALTH_CENTER): Payer: Self-pay

## 2022-04-24 DIAGNOSIS — Z719 Counseling, unspecified: Secondary | ICD-10-CM

## 2022-04-24 DIAGNOSIS — Z23 Encounter for immunization: Secondary | ICD-10-CM

## 2022-04-24 NOTE — Progress Notes (Signed)
Client seen in nurse clinic for Tdap vaccines, also due for COVID-19 vaccine.  VIS given.  Client offered interpretor but declined.    Screening Questions: Are you feeling sick today? No   Have you ever received a dose of COVID-19 Vaccine? [employer], [employer], [place], [place], Other) Yes  If yes, which vaccine and how many doses?    Moderna 4  Did you bring the vaccination record card or other documentation?  Yes   Do you have a health condition or are undergoing treatment that makes you moderately or severely immunocompromised? This would include, but not be limited to: cancer, HIV, organ transplant, immunosuppressive therapy/high-dose corticosteroids, or moderate/severe primary immunodeficiency.  No  Have you received COVID-19 vaccine before or during hematopoietic cell transplant (HCT) or CAR-T-cell therapies? No  Have you ever had an allergic reaction to: (This would include a severe allergic reaction or a reaction that caused hives, swelling, or respiratory distress, including wheezing.) A component of a COVID-19 vaccine or a previous dose of COVID-19 vaccine? No   Have you ever had an allergic reaction to another vaccine (other thanCOVID-19 vaccine) or an injectable medication? (This would include a severe allergic reaction or a reaction that caused hives, swelling, or respiratory distress, including wheezing.)   No    Do you have a history of any of the following:  Myocarditis or Pericarditis No  Dermal fillers:  No  Multisystem Inflammatory Syndrome (MIS-C or MIS-A)? No  COVID-19 disease within the past 3 months? No  Vaccinated with monkeypox vaccine in the last 4 weeks? No    Vaccines administered and tolerated well, after vaccine care reviewed. Copy of [facility] provided.    Browning Southwood R Ragina Fenter

## 2022-05-02 ENCOUNTER — Ambulatory Visit: Payer: Self-pay

## 2023-06-19 ENCOUNTER — Telehealth: Payer: Self-pay | Admitting: *Deleted

## 2023-06-22 ENCOUNTER — Other Ambulatory Visit: Payer: Self-pay | Admitting: *Deleted

## 2023-06-22 ENCOUNTER — Inpatient Hospital Stay
Admission: RE | Admit: 2023-06-22 | Discharge: 2023-06-22 | Disposition: A | Discharge status: Home / Self Care | Payer: Self-pay | Source: Ambulatory Visit | Attending: Nurse Practitioner | Admitting: Nurse Practitioner

## 2023-06-22 DIAGNOSIS — Z1231 Encounter for screening mammogram for malignant neoplasm of breast: Secondary | ICD-10-CM

## 2023-07-25 ENCOUNTER — Other Ambulatory Visit: Payer: Self-pay

## 2023-07-25 DIAGNOSIS — Z1231 Encounter for screening mammogram for malignant neoplasm of breast: Secondary | ICD-10-CM

## 2023-08-13 ENCOUNTER — Ambulatory Visit: Payer: Self-pay | Attending: Hematology and Oncology | Admitting: Hematology and Oncology

## 2023-08-13 ENCOUNTER — Ambulatory Visit
Admission: RE | Admit: 2023-08-13 | Discharge: 2023-08-13 | Disposition: A | Discharge status: Home / Self Care | Payer: Self-pay | Source: Ambulatory Visit | Attending: Obstetrics and Gynecology | Admitting: Obstetrics and Gynecology

## 2023-08-13 VITALS — BP 129/81 | Wt 184.9 lb

## 2023-08-13 DIAGNOSIS — Z1231 Encounter for screening mammogram for malignant neoplasm of breast: Secondary | ICD-10-CM | POA: Insufficient documentation

## 2023-08-13 NOTE — Progress Notes (Signed)
 [name] is a 53 y.o. female who presents to [facility] today with no complaints.    Pap Smear: Pap not smear completed today. Last Pap smear was 05/24/2022 at [facility] clinic and was normal. Per patient has no history of an abnormal Pap smear. Last Pap smear result is available in Epic.   Physical exam: Breasts Breasts symmetrical. No skin abnormalities bilateral breasts. No nipple retraction bilateral breasts. No nipple discharge bilateral breasts. No lymphadenopathy. No lumps palpated bilateral breasts.       Pelvic/Bimanual Pap is not indicated today    Smoking History: Patient has never smoked and was not referred to quit line.    Patient Navigation: Patient education provided. Access to services provided for patient through BCCCP program. Pamela Hartman interpreter provided. No transportation provided   Colorectal Cancer Screening: Per patient has never had colonoscopy completed No complaints today. FIT test completed 06/21/2023 - negative - per [facility]   Breast and Cervical Cancer Risk Assessment: Patient has family history of breast cancer, with her sister who is undergoing surgery today. Patient does not have history of cervical dysplasia, immunocompromised, or DES exposure in-utero.  Risk Scores as of Encounter on 08/13/2023     Pamela Hartman           5-year 2.39%   Lifetime 16.76%   This patient is [place] but has no documented birth country, so the [place] model used data from [place] patients to calculate their risk score. Document a birth country in the Demographics activity for a more accurate score.         Last calculated by Narda Rutherford, LPN on 9/62/9528 at  9:56 AM        A: BCCCP exam without pap smear No complaints with benign exam.   P: Referred patient to the [facility] for a screening mammogram. Appointment scheduled 08/13/2023.  Ilda Basset A, NP 08/13/2023 10:07 AM

## 2023-08-13 NOTE — Patient Instructions (Addendum)
 Taught Pamela Hartman about self breast awareness and gave educational materials to take home. Patient did not need a Pap smear today due to last Pap smear was in 05/24/2022 per patient.  Let her know B[facility]CP will cover Pap smears every 3 years unless has a history of abnormal Pap smears. Referred patient to the [facility] for screening mammogram. Appointment scheduled for 08/13/2023. Patient aware of appointment and will be there. Let patient know will follow up with her within the next couple weeks with results. Pamela Hartman verbalized understanding.  Pascal Lux, NP 9:56 AM

## 2024-01-17 ENCOUNTER — Telehealth: Payer: Self-pay

## 2024-01-17 NOTE — Telephone Encounter (Signed)
 Phone call returned from patient, TB clinic initial evaluation scheduled 01/18/2024 at 9:30 am. Almarie Metro, RN

## 2024-01-17 NOTE — Telephone Encounter (Signed)
 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  Phone call to patient, voice message left asking patient to contact TB clinic nurse at [phone].  Almarie Metro, RN

## 2024-01-18 ENCOUNTER — Other Ambulatory Visit: Payer: Self-pay

## 2024-01-18 ENCOUNTER — Ambulatory Visit (LOCAL_COMMUNITY_HEALTH_CENTER): Payer: Self-pay

## 2024-01-18 VITALS — Wt 168.0 lb

## 2024-01-18 DIAGNOSIS — Z111 Encounter for screening for respiratory tuberculosis: Secondary | ICD-10-CM

## 2024-01-18 DIAGNOSIS — R7612 Nonspecific reaction to cell mediated immunity measurement of gamma interferon antigen response without active tuberculosis: Secondary | ICD-10-CM | POA: Insufficient documentation

## 2024-01-18 LAB — HM HIV SCREENING LAB: HM HIV Screening: NEGATIVE

## 2024-01-18 NOTE — Progress Notes (Signed)
 Patient in clinic today for Tuberculosis screening. Referred from [facility]. US  arrival 01/12/2024.   Prior to US  arrival testing:  12/23/2023 QFT positive CXR 12/19/2023 Patient received MMR (live vaccine) on 12/17/2023 prior to TB testing.   Labs today: QuantiFeron TB test. HIV test  CXR ordered.   Latent TB infection v/s TB disease fact sheet reviewed with patient.   I will contact patient with results and follow-up recommendations. Patient states understanding and agrees. Almarie Metro, RN

## 2024-01-21 ENCOUNTER — Ambulatory Visit
Admission: RE | Admit: 2024-01-21 | Discharge: 2024-01-21 | Disposition: A | Discharge status: Home / Self Care | Payer: Self-pay | Attending: Surgery | Admitting: Surgery

## 2024-01-21 ENCOUNTER — Ambulatory Visit: Payer: Self-pay | Admitting: Surgery

## 2024-01-21 ENCOUNTER — Ambulatory Visit
Admission: RE | Admit: 2024-01-21 | Discharge: 2024-01-21 | Disposition: A | Discharge status: Home / Self Care | Payer: Self-pay | Source: Ambulatory Visit | Attending: Surgery

## 2024-01-21 DIAGNOSIS — Z111 Encounter for screening for respiratory tuberculosis: Secondary | ICD-10-CM

## 2024-01-22 ENCOUNTER — Ambulatory Visit (LOCAL_COMMUNITY_HEALTH_CENTER): Payer: Self-pay

## 2024-01-22 ENCOUNTER — Other Ambulatory Visit: Payer: Self-pay

## 2024-01-22 VITALS — Wt 168.3 lb

## 2024-01-22 DIAGNOSIS — R7612 Nonspecific reaction to cell mediated immunity measurement of gamma interferon antigen response without active tuberculosis: Secondary | ICD-10-CM

## 2024-01-22 DIAGNOSIS — Z227 Latent tuberculosis: Secondary | ICD-10-CM

## 2024-01-22 LAB — QUANTIFERON-TB GOLD PLUS
QuantiFERON Mitogen Value: >10 [IU]/mL
QuantiFERON Nil Value: 0.14 [IU]/mL
QuantiFERON TB1 Ag Value: 0.74 [IU]/mL
QuantiFERON TB2 Ag Value: 0.73 [IU]/mL
QuantiFERON-TB Gold Plus: POSITIVE — AB

## 2024-01-22 MED ORDER — RIFAMPIN 300 MG PO CAPS: 600.0000 mg | ORAL_CAPSULE | Freq: Every day | ORAL | Status: AC

## 2024-01-22 NOTE — Progress Notes (Signed)
 Initial visit for LTBI treatment: Rifampin  #1 Medication written education material given and reviewed with patient.  LTBI treatment consent obtained.  Rifampin  300 mg. #60 dispensed. Advised to take 2 capsules at the same time every day.  Appointment for Rifampin  #2 scheduled 02/13/2024 and reminder card given. TB clinic nurse contact  phone number given to notify any side effects or concerns regarding her treatment.  CBC, hepatic function panel and syphilis screening test obtained.  Patient states understanding and agrees with plan. Almarie Metro, RN

## 2024-01-22 NOTE — Progress Notes (Signed)
 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  CXR, lab results and treatment recommendations options reviewed with patient. She prefers daily treatment x 4 months.   Diagnosis of LTBI and pertinent labs/info: Positive QFT: 01/18/2024 CXR: negative 01/21/2024 EPI: Completed 01/18/2024  HIV: 01/18/2024 Results pending. Syphilis: Offer screening test at initial visit  CBC: Obtain at initial visit LFTs: Obtain at initial visit  Therapy: Rifampin  600 mg daily for 4 months Planned LTBI therapy start date: 01/22/2024  PCP: Osa Geralds, NP.  Tuberculosis treatment orders  All patients are to be monitored per [place] and county TB policies.   _[name]__ has latent TB. Treat for latent TB per the following:  Rifampin  600 mg daily by mouth x 4 months per standing orders ([place]) per Dr. Herlene.   Baseline labs currently indicated. Only needs monthly labs if abnormal baseline or concerning symptoms arise such as persistent rash, itching, nosebleeds, easy bruising, nausea, vomiting, dark colored urine, the patient starts new potentially hepatotoxic medications, or significant alcohol consumption is reported that would require monitoring. Almarie Metro, RN

## 2024-01-23 ENCOUNTER — Ambulatory Visit: Payer: Self-pay | Admitting: Surgery

## 2024-01-23 LAB — CBC WITH DIFFERENTIAL/PLATELET
Basophils Absolute: 0.1 x10E3/uL (ref 0.0–0.2)
Basos: 1 %
EOS (ABSOLUTE): 0.4 x10E3/uL (ref 0.0–0.4)
Eos: 5 %
Hematocrit: 41.7 % (ref 34.0–46.6)
Hemoglobin: 13.9 g/dL (ref 11.1–15.9)
Immature Grans (Abs): 0 x10E3/uL (ref 0.0–0.1)
Immature Granulocytes: 0 %
Lymphocytes Absolute: 2.7 x10E3/uL (ref 0.7–3.1)
Lymphs: 36 %
MCH: 28.1 pg (ref 26.6–33.0)
MCHC: 33.3 g/dL (ref 31.5–35.7)
MCV: 84 fL (ref 79–97)
Monocytes Absolute: 0.4 x10E3/uL (ref 0.1–0.9)
Monocytes: 5 %
Neutrophils Absolute: 4 x10E3/uL (ref 1.4–7.0)
Neutrophils: 53 %
Platelets: 291 x10E3/uL (ref 150–450)
RBC: 4.94 x10E6/uL (ref 3.77–5.28)
RDW: 13 % (ref 11.7–15.4)
WBC: 7.5 x10E3/uL (ref 3.4–10.8)

## 2024-01-23 LAB — HEPATIC FUNCTION PANEL
ALT: 13 IU/L (ref 0–32)
AST: 16 IU/L (ref 0–40)
Albumin: 4.3 g/dL (ref 3.8–4.9)
Alkaline Phosphatase: 86 IU/L (ref 44–121)
Bilirubin Total: <0.2 mg/dL (ref 0.0–1.2)
Bilirubin, Direct: <0.08 mg/dL (ref 0.00–0.40)
Total Protein: 6.9 g/dL (ref 6.0–8.5)

## 2024-01-24 ENCOUNTER — Encounter: Payer: Self-pay | Admitting: Surgery

## 2024-02-04 ENCOUNTER — Ambulatory Visit (LOCAL_COMMUNITY_HEALTH_CENTER): Payer: Self-pay

## 2024-02-04 DIAGNOSIS — Z227 Latent tuberculosis: Secondary | ICD-10-CM

## 2024-02-04 NOTE — Progress Notes (Signed)
 Pt walked in to clinic to discuss symptoms she is concerned may be a side effect of her LTBI medication. Utilized Spanish language interpreter line throughout encounter.  She started Rifampin  600mg  daily on 01/22/24. For past 3 days she has not had ability to pass gas or burp, but has had bowel movements in that time. No abdominal discomfort. She is wondering if she can have fruit/smoothies while taking her medication. Discussed how taking on an empty stomach is ideal but not required if causing discomfort - ok to take with food/juice. She had been specifically avoiding fruit; advised she does not need to do that.  Next appt already scheduled for 10/1 at 9am; reviewed next appt date/time.  Delon LITTIE Primrose, RN

## 2024-02-13 ENCOUNTER — Ambulatory Visit: Payer: Self-pay

## 2024-02-13 VITALS — Wt 184.0 lb

## 2024-02-13 DIAGNOSIS — Z227 Latent tuberculosis: Secondary | ICD-10-CM

## 2024-02-13 DIAGNOSIS — Z719 Counseling, unspecified: Secondary | ICD-10-CM

## 2024-02-13 MED ORDER — RIFAMPIN 300 MG PO CAPS: 600.0000 mg | ORAL_CAPSULE | Freq: Every day | ORAL | Status: AC

## 2024-02-13 NOTE — Progress Notes (Cosign Needed Addendum)
 In nurse clinic for LTBI / TB Med Management / Rifampin  # 2  / Labs: none Interpreter M [place]  Taking Rifampin  as prescribed. Takes at bedtime.  Denies Missing any pills Approx 18 pills (9 day supply)  remaining in current bottle.  Advised to complete this bottle before starting bottle that will be dispensed today.    Meds: Tylenol and ibuprofen prn Takes Vit C  New Health problems:  Patient explains she had covid last week and was seen by provider at [facility]. Had runny nose and respiratory symptoms.  Took tylenol 2 capsules prn with last dose 2 days ago.  Also took ibuprofen prn between 600 to 800 mg daily. Last dose yesterday and took 600 mg.  States she is feeling good now and no symptoms.   Desires flu shot today, but declined after learning of the cost for shot today and says she will try to find a clinic that has a less costly flu vaccine.   Alcohol: denies. Advised to abstain from alcohol as Rifampin  can damage the liver.   BCM: post menopausal  Phone call to Dr JINNY Leghorn and informed of patient status. Ok to continue Rifampin . No labs ordered today. Ok to get flu shot today if desires.   The patient was dispensed Rifampin  300 mg #60 today per Dr JINNY Leghorn. Instructions reviewed on how to take med.  I provided counseling today regarding the medication. We discussed the medication, the side effects and when to call clinic. Patient given the opportunity to ask questions. Questions answered.    TB contact card given and advised to contact with questions, concerns, side effects.  Next TB med appt scheduled for 03/19/2024 at 8:30 pm per patient preference. Appt card given.    RN supervisor notified of appt as [facility] schedule for November not available at this time.  Carolie Mcilrath, RN

## 2024-03-19 ENCOUNTER — Ambulatory Visit (LOCAL_COMMUNITY_HEALTH_CENTER): Payer: Self-pay

## 2024-03-19 VITALS — Wt 186.5 lb

## 2024-03-19 DIAGNOSIS — Z227 Latent tuberculosis: Secondary | ICD-10-CM

## 2024-03-19 MED ORDER — RIFAMPIN 300 MG PO CAPS: 600.0000 mg | ORAL_CAPSULE | Freq: Every day | ORAL | Status: AC

## 2024-03-19 NOTE — Progress Notes (Signed)
 In nurse clinic for LTBI / TB Med Management / Rifampin  # 3  / Labs: none Interpreter M [place]  Taking Rifampin  as prescribed Denies Missing any pills.  Approx 8 pills (4 day supply) remaining in current bottle.  Advised to complete this bottle before starting bottle that will be dispensed today.   Meds/ Health problems:  Takes tylenol prn and allergy D  pill prn from [facility], last used approx 2 weeks ago for mild headache and allergies which she described as itchy nose and sneezing. Symptoms resolved after 3 days.  Also started taking Vitamin C  Patient reports feeling hungrier over past few months and feels she is eating more and gaining wt.   Wt today 186.5, increase of 2.5 lbs from last visit on 02/13/2024.  Per epic, weight at TB med start on 01/22/2024 = 168 lbs 5 oz.   Phone consult with Dr JINNY Leghorn after patient left and provider informed of patient status and wt gain. Does not consider wt gain to be related to TB med management. Recommends RN to advise patient at her next TB med appt to f-u with PCP for wt gain.   Alcohol: denies. Advised to abstain from alcohol as Rifampin  can damage the liver.   BCM: post menopausal  The patient was dispensed Rifampin  300 mg #60 today per order by Dr JINNY Leghorn. I provided counseling today regarding the medication. We discussed the medication, the side effects and when to call clinic. Patient given the opportunity to ask questions. Questions answered.    TB contact card given and advised to contact with questions, concerns, side effects.  Rifampin  information sheet offered but declined as she has one at home.  Next TB med appt scheduled for 04/16/2024 at 8:30 am. Appt card given.    Epic message sent to [facility] supervisor to place appt on schedule as December schedule not available at this time. Alyia Lacerte, RN

## 2024-04-16 ENCOUNTER — Telehealth: Payer: Self-pay

## 2024-04-16 NOTE — Telephone Encounter (Signed)
 Called twice, in attempt to reschedule missed appt at [facility]. Unable to leave voicemail.

## 2024-04-21 ENCOUNTER — Ambulatory Visit: Payer: Self-pay

## 2024-04-21 VITALS — Wt 190.0 lb

## 2024-04-21 DIAGNOSIS — Z227 Latent tuberculosis: Secondary | ICD-10-CM

## 2024-04-21 MED ORDER — RIFAMPIN 300 MG PO CAPS: 600.0000 mg | ORAL_CAPSULE | Freq: Every day | ORAL | Status: AC

## 2024-04-21 NOTE — Progress Notes (Signed)
 In nurse clinic for LTBI / TB Med Completion/ Rifampin  # 4  / Labs: none Interpreter I [place]  Taking Rifampin  as prescribed: YES Missing any pills: NO Two pills remaining in current bottle. Plans to take this evening at 7 pm.  Advised to complete this bottle before starting bottle that will be dispensed today.   Changes in  Meds: none New meds: denies  Takes only Rifampin , no other meds Epic med list reviewed with patient.   New Health problems: denies  Patient wt today 190 lb.  Patient notes that her weight has been consistent without extreme fluctuations.   Per Epic, wt at TB med start on 01/22/2024 = 168.5 lb. RN noted this to patient and she states her weight is consistent and she cannot recall a time when her weight was 168 lbs.   Alcohol: denies. Advised to abstain from alcohol as Rifampin  can damage the liver.   BCM: post menopausal  The patient was dispensed Rifampin  300 mg #60 (Bottle #4) today per Dr JINNY Leghorn. I provided counseling today regarding the medication. We discussed the medication, the side effects and when to call clinic. Patient given the opportunity to ask questions. Questions answered.    TB contact card given and advised to contact with questions, concerns, side effects.  Rifampin  information sheet given and reviewed.  TB completion patient letters (English and Spanish) given and reviewed.  TB completion card given and reviewed. Estimated Rifampin  completion date 05/21/2024.   PCP [facility] Noretta Cave, NP ROI signed PCP completion letter successfully faxed to [facility]. Nnaemeka Samson, RN
